# Patient Record
Sex: Female | Born: 1950 | Race: White | Hispanic: No | Marital: Married | Smoking: Never smoker
Health system: Southern US, Community
[De-identification: ages and names within clinical notes are randomized; demographics above are authoritative.]

---

## 2000-01-11 ENCOUNTER — Ambulatory Visit (HOSPITAL_COMMUNITY): Admission: RE | Admit: 2000-01-11 | Discharge: 2000-01-11 | Payer: Self-pay | Admitting: Family Medicine

## 2000-01-11 ENCOUNTER — Encounter: Payer: Self-pay | Admitting: Family Medicine

## 2000-03-25 ENCOUNTER — Other Ambulatory Visit: Admission: RE | Admit: 2000-03-25 | Discharge: 2000-03-25 | Payer: Self-pay | Admitting: *Deleted

## 2000-07-29 ENCOUNTER — Encounter: Payer: Self-pay | Admitting: *Deleted

## 2000-08-04 ENCOUNTER — Inpatient Hospital Stay (HOSPITAL_COMMUNITY): Admission: RE | Admit: 2000-08-04 | Discharge: 2000-08-06 | Payer: Self-pay | Admitting: *Deleted

## 2000-08-09 ENCOUNTER — Encounter: Admission: RE | Admit: 2000-08-09 | Discharge: 2000-08-09 | Payer: Self-pay | Admitting: General Surgery

## 2000-08-09 ENCOUNTER — Encounter: Payer: Self-pay | Admitting: General Surgery

## 2002-02-06 ENCOUNTER — Inpatient Hospital Stay (HOSPITAL_COMMUNITY): Admission: AD | Admit: 2002-02-06 | Discharge: 2002-02-09 | Payer: Self-pay | Admitting: Obstetrics & Gynecology

## 2002-02-07 ENCOUNTER — Encounter: Payer: Self-pay | Admitting: Obstetrics & Gynecology

## 2003-12-09 ENCOUNTER — Other Ambulatory Visit: Admission: RE | Admit: 2003-12-09 | Discharge: 2003-12-09 | Payer: Self-pay | Admitting: Family Medicine

## 2006-04-26 ENCOUNTER — Other Ambulatory Visit: Admission: RE | Admit: 2006-04-26 | Discharge: 2006-04-26 | Payer: Self-pay | Admitting: Family Medicine

## 2010-11-05 ENCOUNTER — Other Ambulatory Visit (HOSPITAL_COMMUNITY)
Admission: RE | Admit: 2010-11-05 | Discharge: 2010-11-05 | Disposition: A | Discharge status: Home / Self Care | Payer: BC Managed Care – PPO | Source: Ambulatory Visit | Attending: Family Medicine | Admitting: Family Medicine

## 2010-11-05 ENCOUNTER — Other Ambulatory Visit: Payer: Self-pay | Admitting: Family Medicine

## 2010-11-05 DIAGNOSIS — Z0000 Encounter for general adult medical examination without abnormal findings: Secondary | ICD-10-CM | POA: Insufficient documentation

## 2017-04-21 ENCOUNTER — Encounter (HOSPITAL_COMMUNITY): Payer: Self-pay | Admitting: Emergency Medicine

## 2017-04-21 ENCOUNTER — Emergency Department (HOSPITAL_COMMUNITY): Payer: Self-pay

## 2017-04-21 ENCOUNTER — Other Ambulatory Visit: Payer: Self-pay

## 2017-04-21 DIAGNOSIS — R0789 Other chest pain: Secondary | ICD-10-CM | POA: Insufficient documentation

## 2017-04-21 LAB — BASIC METABOLIC PANEL
ANION GAP: 11 (ref 5–15)
BUN: 10 mg/dL (ref 6–20)
CHLORIDE: 105 mmol/L (ref 101–111)
CO2: 23 mmol/L (ref 22–32)
Calcium: 9.7 mg/dL (ref 8.9–10.3)
Creatinine, Ser: 0.72 mg/dL (ref 0.44–1.00)
GFR calc Af Amer: >60 mL/min (ref >60)
GLUCOSE: 97 mg/dL (ref 65–99)
POTASSIUM: 3.6 mmol/L (ref 3.5–5.1)
Sodium: 139 mmol/L (ref 135–145)

## 2017-04-21 LAB — CBC
HCT: 40.1 % (ref 36.0–46.0)
HEMOGLOBIN: 13.5 g/dL (ref 12.0–15.0)
MCH: 30.2 pg (ref 26.0–34.0)
MCHC: 33.7 g/dL (ref 30.0–36.0)
MCV: 89.7 fL (ref 78.0–100.0)
Platelets: 311 10*3/uL (ref 150–400)
RBC: 4.47 MIL/uL (ref 3.87–5.11)
RDW: 12.7 % (ref 11.5–15.5)
WBC: 5.8 10*3/uL (ref 4.0–10.5)

## 2017-04-21 LAB — I-STAT TROPONIN, ED: Troponin i, poc: 0 ng/mL (ref 0.00–0.08)

## 2017-04-21 NOTE — ED Triage Notes (Signed)
Pt complaint of mid chest pressure radiating to shoulders and jaw onset at 1530 but subsides some since.

## 2017-04-22 ENCOUNTER — Emergency Department (HOSPITAL_COMMUNITY)
Admission: EM | Admit: 2017-04-22 | Discharge: 2017-04-22 | Disposition: A | Discharge status: Home / Self Care | Payer: Self-pay | Attending: Emergency Medicine | Admitting: Emergency Medicine

## 2017-04-22 DIAGNOSIS — R079 Chest pain, unspecified: Secondary | ICD-10-CM

## 2017-04-22 LAB — I-STAT TROPONIN, ED: Troponin i, poc: 0 ng/mL (ref 0.00–0.08)

## 2017-04-22 NOTE — Discharge Instructions (Signed)

## 2017-04-22 NOTE — ED Provider Notes (Signed)
[facility] DEPT Provider Note   CSN: [id number] Arrival date & time: 04/21/17  1806     History   Chief Complaint Chief Complaint  Patient presents with  . Chest Pain    HPI Gina Vega is a 67 y.o. female who presents the emergency department with chief complaint of chest pain.  Around 2 PM today she had onset of central retrosternal chest pain and pressure radiating to the right side of her chest, right jaw and right shoulder and some radiation into the left side of her chest.  She had associated shortness of breath.  This lasted for approximately 30 minutes and relieved however has been persistent but minimal since that time for the past 12 hours.  The patient states that she has been under significant stress and was driving to work today from [employer] power of attorney issues for a family friend.  The patient denies history of smoking, hypertension, hyperlipidemia, diabetes.  Her mother did have coronary artery disease and it was stented in her 39s however the patient denies a personal history of such.  She did not take any aspirin or nitroglycerin.  She was seen at an urgent care prior to arrival and directed here to the emergency department.  She denies nausea, vomiting, diaphoresis.  She still has a little bit of a sensation of tightness with deep breathing but denies pain.  She recently has had some symptoms of gastric reflux and has been taking over-the-counter antacids but states that this felt much different.  She has not had fever or chills.  HPI  History reviewed. No pertinent past medical history.  There are no active problems to display for this patient.   History reviewed. No pertinent surgical history.  OB History    No data available       Home Medications    Prior to Admission medications   Not on File    Family History No family history on file.  Social History Social History   Tobacco Use  . Smoking  status: Not on file  Substance Use Topics  . Alcohol use: Not on file  . Drug use: Not on file     Allergies   Latex   Review of Systems Review of Systems  Ten systems reviewed and are negative for acute change, except as noted in the HPI.   Physical Exam Updated Vital Signs BP (!) 146/74 (BP Location: Left Arm)   Pulse (!) 58   Temp 97.9 F (36.6 C) (Oral)   Resp 14   SpO2 99%   Physical Exam Physical Exam  Nursing note and vitals reviewed. Constitutional: She is oriented to person, place, and time. She appears well-developed and well-nourished. No distress.  HENT:  Head: Normocephalic and atraumatic.  Eyes: Conjunctivae normal and EOM are normal.  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  No scleral icterus.  Neck: Normal range of motion.  Cardiovascular: Normal rate, regular rhythm and normal heart sounds.  Exam reveals no gallop and no friction rub.   No murmur heard. Pulmonary/Chest: Effort normal and breath sounds normal. No respiratory distress.  Abdominal: Soft. Bowel sounds are normal. She exhibits no distension and no mass. There is no tenderness. There is no guarding.  Neurological: She is alert and oriented to person, place, and time.  Skin: Skin is warm and dry. She is not diaphoretic.     ED Treatments / Results  Labs (all labs ordered are listed, but  only abnormal results are displayed) Labs Reviewed  BASIC METABOLIC PANEL  CBC  I-STAT TROPONIN, ED  I-STAT TROPONIN, ED    EKG  EKG Interpretation  Date/Time:  Friday April 22 2017 05:04:42 EST Ventricular Rate:  53 PR Interval:    QRS Duration: 95 QT Interval:  410 QTC Calculation: 385 R Axis:   70 Text Interpretation:  Sinus rhythm Normal ECG No significant change was found Confirmed by Shanon Rosser [phone]) on 04/22/2017 5:28:19 AM       Radiology Dg Chest 2 View  Result Date: 04/21/2017 CLINICAL DATA:  Chest pain EXAM: CHEST  2 VIEW COMPARISON:  None.  FINDINGS: The heart size and mediastinal contours are within normal limits. Both lungs are clear. The visualized skeletal structures are unremarkable. Surgical clips in the right upper quadrant. IMPRESSION: No active cardiopulmonary disease. Electronically Signed   By: Donavan Foil M.D.   On: 04/21/2017 19:19    Procedures Procedures (including critical care time)  Medications Ordered in ED Medications - No data to display   Initial Impression / Assessment and Plan / ED Course  I have reviewed the triage vital signs and the nursing notes.  Pertinent labs & imaging results that were available during my care of the patient were reviewed by me and considered in my medical decision making (see chart for details).    Patient with 2- troponins over period of 12 hours.  Her EKG shows no signs of ischemic change.  Chest x-ray without abnormality.  She is a heart score of 2 making her low risk for major adverse cardiac event.  She is not having any current chest pain or shortness of breath patient advised to follow-up with her primary care physician.  I doubt any other emergent etiology chest discomfort.  She is well-appearing discussed return precautions.  Final Clinical Impressions(s) / ED Diagnoses   Final diagnoses:  Nonspecific chest pain    ED Discharge Orders    None       Margarita Mail, PA-C 04/22/17 0704    Shanon Rosser, MD 04/22/17 [phone]

## 2017-08-15 ENCOUNTER — Emergency Department (HOSPITAL_COMMUNITY): Payer: Self-pay

## 2017-08-15 ENCOUNTER — Encounter (HOSPITAL_COMMUNITY): Payer: Self-pay

## 2017-08-15 DIAGNOSIS — Y999 Unspecified external cause status: Secondary | ICD-10-CM | POA: Insufficient documentation

## 2017-08-15 DIAGNOSIS — Y92007 Garden or yard of unspecified non-institutional (private) residence as the place of occurrence of the external cause: Secondary | ICD-10-CM | POA: Insufficient documentation

## 2017-08-15 DIAGNOSIS — Y9301 Activity, walking, marching and hiking: Secondary | ICD-10-CM | POA: Insufficient documentation

## 2017-08-15 DIAGNOSIS — W1842XA Slipping, tripping and stumbling without falling due to stepping into hole or opening, initial encounter: Secondary | ICD-10-CM | POA: Insufficient documentation

## 2017-08-15 DIAGNOSIS — S82831A Other fracture of upper and lower end of right fibula, initial encounter for closed fracture: Secondary | ICD-10-CM | POA: Insufficient documentation

## 2017-08-15 NOTE — ED Triage Notes (Signed)
Pt reports that she step in a hole and fell and sustained injury to rt ankle are appears swollen and pt has va 9/10 pain that is descibed as sharp and hot at time.Pt denies any other injury, no LOC.

## 2017-08-16 ENCOUNTER — Emergency Department (HOSPITAL_COMMUNITY)
Admission: EM | Admit: 2017-08-16 | Discharge: 2017-08-16 | Disposition: A | Discharge status: Home / Self Care | Payer: Self-pay | Attending: Emergency Medicine | Admitting: Emergency Medicine

## 2017-08-16 ENCOUNTER — Emergency Department (HOSPITAL_COMMUNITY): Payer: Self-pay

## 2017-08-16 ENCOUNTER — Other Ambulatory Visit: Payer: Self-pay

## 2017-08-16 DIAGNOSIS — S82831A Other fracture of upper and lower end of right fibula, initial encounter for closed fracture: Secondary | ICD-10-CM

## 2017-08-16 MED ORDER — HYDROCODONE-ACETAMINOPHEN 5-325 MG PO TABS
2.0000 | ORAL_TABLET | Freq: Once | ORAL | Status: AC
  Administered 2017-08-16: 2 via ORAL
  Filled 2017-08-16: qty 2

## 2017-08-16 MED ORDER — HYDROCODONE-ACETAMINOPHEN 5-325 MG PO TABS: 1.0000 | ORAL_TABLET | ORAL | 0 refills | Status: AC | PRN

## 2017-08-16 NOTE — ED Notes (Signed)
Ortho tech to be paged for posterior splint application

## 2017-08-16 NOTE — ED Provider Notes (Signed)
[facility] DEPT Provider Note   CSN: [id number] Arrival date & time: 08/15/17  2143     History   Chief Complaint Chief Complaint  Patient presents with  . Ankle Pain    HPI Gina Vega is a 67 y.o. female.  Patient presents to the emergency department with a chief complaint of right ankle pain.  She states that she stepped in a hole in her yard and rolled her ankle.  She complains of pain with palpation and movement.  She has been unable to ambulate because of the pain.  She denies any numbness, weakness, or tingling.  She has not taken anything for symptoms.  The history is provided by the patient. No language interpreter was used.    History reviewed. No pertinent past medical history.  There are no active problems to display for this patient.   History reviewed. No pertinent surgical history.   OB History   None      Home Medications    Prior to Admission medications   Medication Sig Start Date End Date Taking? Authorizing Provider  HYDROcodone-acetaminophen (NORCO/VICODIN) 5-325 MG tablet Take 1-2 tablets by mouth every 4 (four) hours as needed. 08/16/17   Montine Circle, PA-C    Family History History reviewed. No pertinent family history.  Social History Social History   Tobacco Use  . Smoking status: Never Smoker  . Smokeless tobacco: Never Used  Substance Use Topics  . Alcohol use: Not Currently  . Drug use: Never     Allergies   Latex   Review of Systems Review of Systems  All other systems reviewed and are negative.    Physical Exam Updated Vital Signs BP 135/75 (BP Location: Right Arm)   Pulse (!) 55   Temp 98.6 F (37 C) (Oral)   Resp 16   Ht 5\' 3"  (1.6 m)   Wt 65.8 kg (145 lb)   SpO2 99%   BMI 25.69 kg/m   Physical Exam  Nursing note and vitals reviewed.  Constitutional: Pt appears well-developed and well-nourished. No distress.  HENT:  Head: Normocephalic and atraumatic.    Eyes: Conjunctivae are normal.  Neck: Normal range of motion.  Cardiovascular: Normal rate, regular rhythm. Intact distal pulses.   Capillary refill < 3 sec.  Pulmonary/Chest: Effort normal and breath sounds normal.  Musculoskeletal:  Right lower extremity Pt exhibits moderate swelling about the lateral ankle with tenderness to palpation over the distal fibula.   ROM: Deferred secondary to pain  Strength: Deferred secondary to pain Neurological: Pt  is alert. Coordination normal.  Sensation: 5/5 Skin: Skin is warm and dry. Pt is not diaphoretic.  No evidence of open wound or skin tenting Psychiatric: Pt has a normal mood and affect.     ED Treatments / Results  Labs (all labs ordered are listed, but only abnormal results are displayed) Labs Reviewed - No data to display  EKG None  Radiology Dg Ankle Complete Right  Result Date: 08/15/2017 CLINICAL DATA:  Patient stepped in a hole earlier today and twisted the ankle. Lateral pain and swelling. EXAM: RIGHT ANKLE - COMPLETE 3+ VIEW COMPARISON:  None. FINDINGS: There is a transverse fracture of the distal right fibula with extension to the talofibular joint. No significant dislocation of the right ankle. Lateral soft tissue swelling. IMPRESSION: Transverse fracture of the distal right fibula with overlying soft tissue swelling. Electronically Signed   By: Lucienne Capers M.D.   On: 08/15/2017 23:07   Dg  Knee Complete 4 Views Right  Result Date: 08/16/2017 CLINICAL DATA:  Patient stepped in a hole on 08/15/2017. Fracture to the ankle. Knee hurts. EXAM: RIGHT KNEE - COMPLETE 4+ VIEW COMPARISON:  None. FINDINGS: Degenerative changes in the right knee with medial and lateral compartment narrowing and small osteophytes. No evidence of acute fracture or dislocation. No focal bone lesion or bone destruction. Bone cortex appears intact. No significant effusion. Soft tissues are unremarkable. IMPRESSION: Mild degenerative changes in the right  knee. No acute bony abnormalities. Electronically Signed   By: Lucienne Capers M.D.   On: 08/16/2017 03:02    Procedures Procedures (including critical care time)  Medications Ordered in ED Medications  HYDROcodone-acetaminophen (NORCO/VICODIN) 5-325 MG per tablet 2 tablet (2 tablets Oral Given 08/16/17 0238)     Initial Impression / Assessment and Plan / ED Course  I have reviewed the triage vital signs and the nursing notes.  Pertinent labs & imaging results that were available during my care of the patient were reviewed by me and considered in my medical decision making (see chart for details).     Patient presents with injury to right ankle.  DDx includes, fracture, strain, or sprain.  Consultants: None  Plain films reveal transverse distal fibula fracture.  Pt advised to follow up with PCP and/or orthopedics. Patient given posterior splint and crutches while in ED, conservative therapy such as RICE recommended and discussed.   Patient will be discharged home & is agreeable with above plan. Returns precautions discussed. Pt appears safe for discharge.   Final Clinical Impressions(s) / ED Diagnoses   Final diagnoses:  Other closed fracture of distal end of right fibula, initial encounter    ED Discharge Orders        Ordered    HYDROcodone-acetaminophen (NORCO/VICODIN) 5-325 MG tablet  Every 4 hours PRN     08/16/17 0307       Montine Circle, PA-C 08/16/17 5409    Ezequiel Essex, MD 08/16/17 [phone]

## 2017-08-16 NOTE — ED Notes (Signed)
Bed: WTR5 Expected date:  Expected time:  Means of arrival:  Comments: 

## 2017-10-05 DIAGNOSIS — H40023 Open angle with borderline findings, high risk, bilateral: Secondary | ICD-10-CM | POA: Diagnosis not present

## 2017-10-18 DIAGNOSIS — S82831D Other fracture of upper and lower end of right fibula, subsequent encounter for closed fracture with routine healing: Secondary | ICD-10-CM | POA: Diagnosis not present

## 2018-01-09 DIAGNOSIS — R69 Illness, unspecified: Secondary | ICD-10-CM | POA: Diagnosis not present

## 2018-02-23 DIAGNOSIS — J209 Acute bronchitis, unspecified: Secondary | ICD-10-CM | POA: Diagnosis not present

## 2018-04-10 DIAGNOSIS — H40023 Open angle with borderline findings, high risk, bilateral: Secondary | ICD-10-CM | POA: Diagnosis not present

## 2018-05-05 DIAGNOSIS — Z0000 Encounter for general adult medical examination without abnormal findings: Secondary | ICD-10-CM | POA: Diagnosis not present

## 2018-05-05 DIAGNOSIS — Z23 Encounter for immunization: Secondary | ICD-10-CM | POA: Diagnosis not present

## 2018-05-05 DIAGNOSIS — E785 Hyperlipidemia, unspecified: Secondary | ICD-10-CM | POA: Diagnosis not present

## 2018-10-02 DIAGNOSIS — M8589 Other specified disorders of bone density and structure, multiple sites: Secondary | ICD-10-CM | POA: Diagnosis not present

## 2018-10-02 DIAGNOSIS — Z90710 Acquired absence of both cervix and uterus: Secondary | ICD-10-CM | POA: Diagnosis not present

## 2018-10-02 DIAGNOSIS — Z780 Asymptomatic menopausal state: Secondary | ICD-10-CM | POA: Diagnosis not present

## 2018-10-11 DIAGNOSIS — Z20828 Contact with and (suspected) exposure to other viral communicable diseases: Secondary | ICD-10-CM | POA: Diagnosis not present

## 2018-10-12 DIAGNOSIS — Z20828 Contact with and (suspected) exposure to other viral communicable diseases: Secondary | ICD-10-CM | POA: Diagnosis not present

## 2018-11-25 DIAGNOSIS — R69 Illness, unspecified: Secondary | ICD-10-CM | POA: Diagnosis not present

## 2018-12-22 DIAGNOSIS — N8110 Cystocele, unspecified: Secondary | ICD-10-CM | POA: Diagnosis not present

## 2018-12-25 ENCOUNTER — Other Ambulatory Visit: Payer: Self-pay

## 2018-12-25 DIAGNOSIS — Z20822 Contact with and (suspected) exposure to covid-19: Secondary | ICD-10-CM

## 2018-12-26 LAB — NOVEL CORONAVIRUS, NAA: SARS-CoV-2, NAA: NOT DETECTED

## 2019-01-10 ENCOUNTER — Other Ambulatory Visit: Payer: Self-pay

## 2019-01-10 DIAGNOSIS — Z20822 Contact with and (suspected) exposure to covid-19: Secondary | ICD-10-CM

## 2019-01-12 LAB — NOVEL CORONAVIRUS, NAA: SARS-CoV-2, NAA: NOT DETECTED

## 2019-01-30 IMAGING — CR DG ANKLE COMPLETE 3+V*R*
3 series · 3 of 3 positions shown · non-contrast
Comparison: None.

CLINICAL DATA: Patient stepped in a hole earlier today and twisted
the ankle. Lateral pain and swelling.

EXAM:
RIGHT ANKLE - COMPLETE 3+ VIEW

[x ankle ap right]
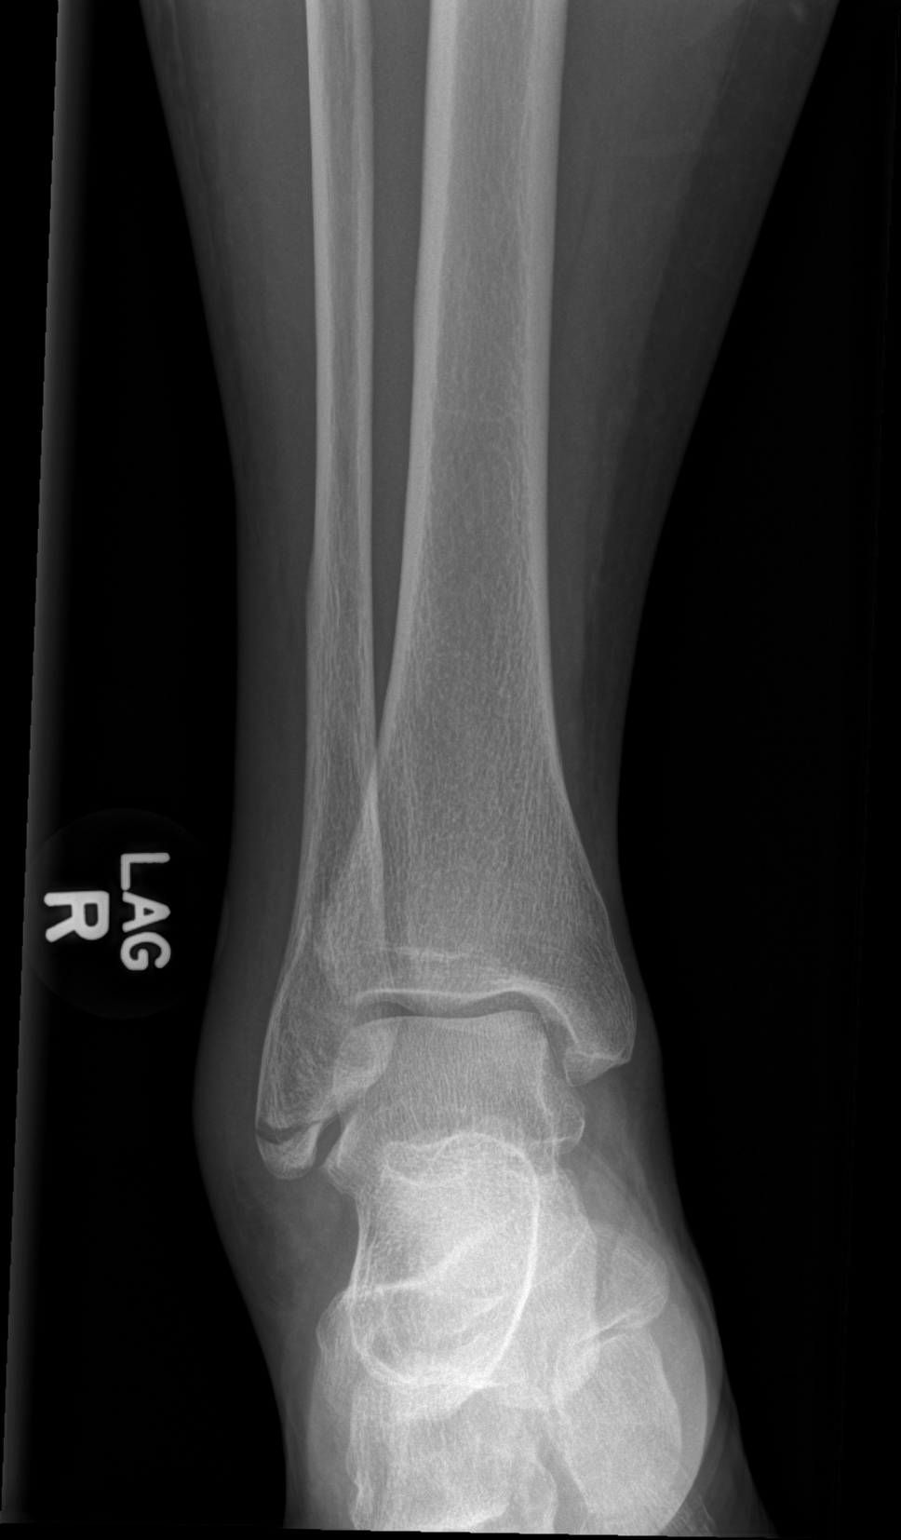

[x ankle obl right]
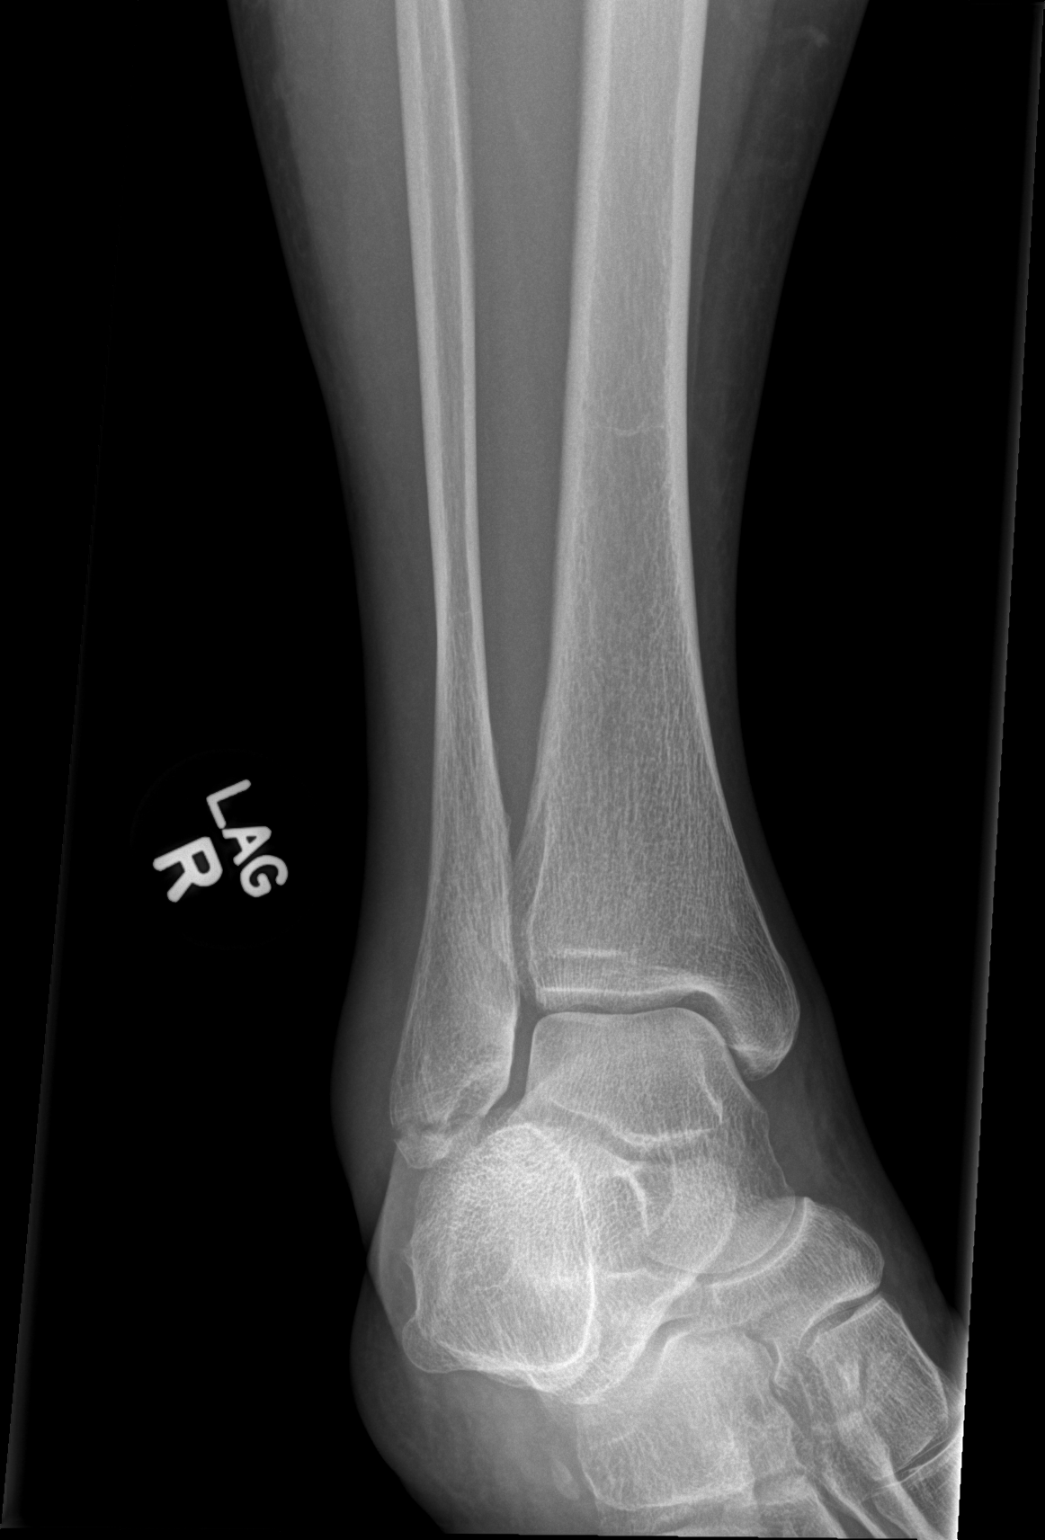

[x ankle lat right]
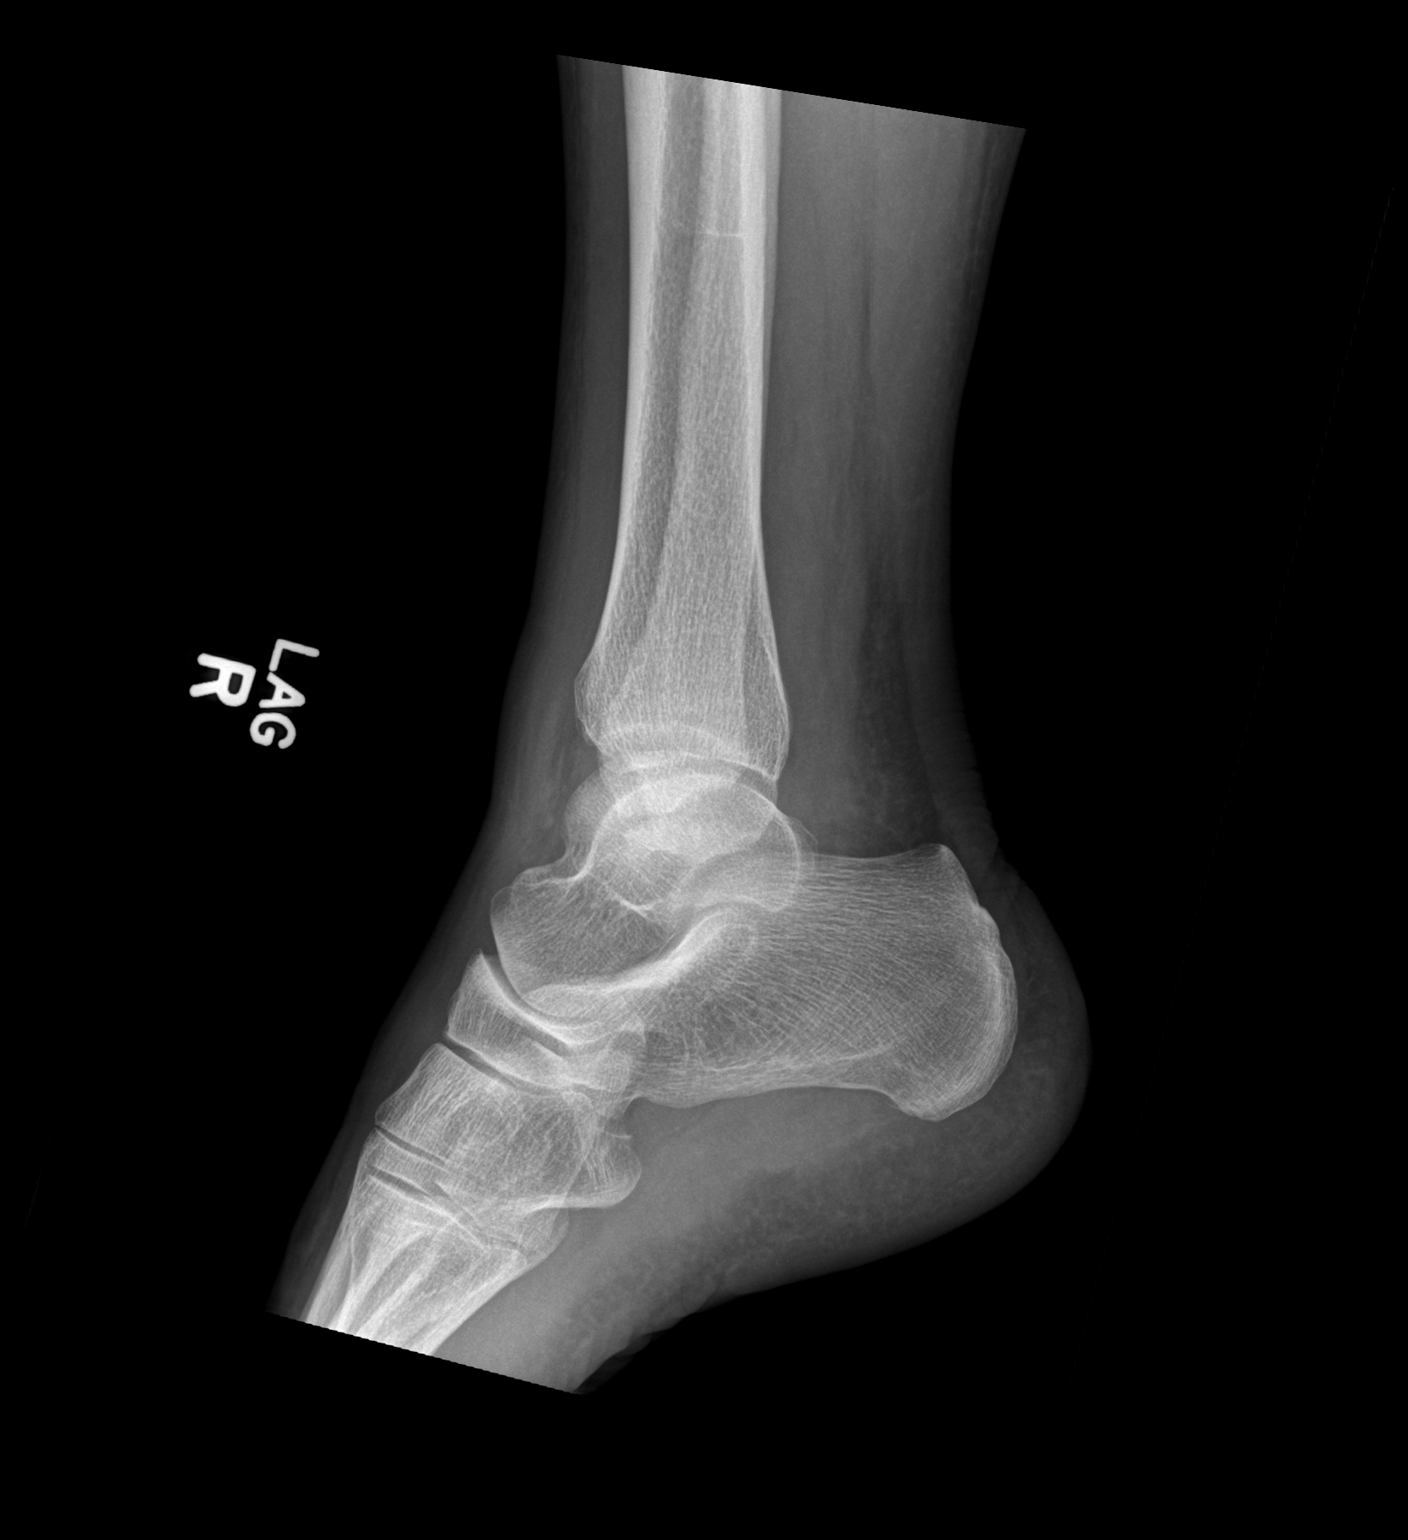

[3 of 3 positions shown; findings below may reference images not displayed]

FINDINGS: There is a transverse fracture of the distal right fibula with
extension to the talofibular joint. No significant dislocation of
the right ankle. Lateral soft tissue swelling.
IMPRESSION: Transverse fracture of the distal right fibula with overlying soft
tissue swelling.

## 2019-02-07 DIAGNOSIS — H40023 Open angle with borderline findings, high risk, bilateral: Secondary | ICD-10-CM | POA: Diagnosis not present

## 2019-02-07 DIAGNOSIS — H2513 Age-related nuclear cataract, bilateral: Secondary | ICD-10-CM | POA: Diagnosis not present

## 2019-02-07 DIAGNOSIS — H40033 Anatomical narrow angle, bilateral: Secondary | ICD-10-CM | POA: Diagnosis not present

## 2019-03-15 DIAGNOSIS — M6281 Muscle weakness (generalized): Secondary | ICD-10-CM | POA: Diagnosis not present

## 2019-03-15 DIAGNOSIS — M62838 Other muscle spasm: Secondary | ICD-10-CM | POA: Diagnosis not present

## 2019-03-15 DIAGNOSIS — M6289 Other specified disorders of muscle: Secondary | ICD-10-CM | POA: Diagnosis not present

## 2019-03-15 DIAGNOSIS — N8110 Cystocele, unspecified: Secondary | ICD-10-CM | POA: Diagnosis not present

## 2019-03-20 ENCOUNTER — Ambulatory Visit: Payer: Medicare HMO | Attending: Internal Medicine

## 2019-03-20 DIAGNOSIS — Z20822 Contact with and (suspected) exposure to covid-19: Secondary | ICD-10-CM

## 2019-03-21 LAB — NOVEL CORONAVIRUS, NAA: SARS-CoV-2, NAA: NOT DETECTED

## 2019-04-02 DIAGNOSIS — M6289 Other specified disorders of muscle: Secondary | ICD-10-CM | POA: Diagnosis not present

## 2019-04-02 DIAGNOSIS — M62838 Other muscle spasm: Secondary | ICD-10-CM | POA: Diagnosis not present

## 2019-04-02 DIAGNOSIS — N8110 Cystocele, unspecified: Secondary | ICD-10-CM | POA: Diagnosis not present

## 2019-04-02 DIAGNOSIS — M6281 Muscle weakness (generalized): Secondary | ICD-10-CM | POA: Diagnosis not present

## 2019-04-03 ENCOUNTER — Ambulatory Visit: Payer: Medicare HMO | Attending: Internal Medicine

## 2019-04-03 DIAGNOSIS — Z20822 Contact with and (suspected) exposure to covid-19: Secondary | ICD-10-CM | POA: Diagnosis not present

## 2019-04-04 LAB — NOVEL CORONAVIRUS, NAA: SARS-CoV-2, NAA: NOT DETECTED

## 2019-04-24 DIAGNOSIS — N8110 Cystocele, unspecified: Secondary | ICD-10-CM | POA: Diagnosis not present

## 2019-04-24 DIAGNOSIS — M6289 Other specified disorders of muscle: Secondary | ICD-10-CM | POA: Diagnosis not present

## 2019-04-24 DIAGNOSIS — M6281 Muscle weakness (generalized): Secondary | ICD-10-CM | POA: Diagnosis not present

## 2019-04-24 DIAGNOSIS — M62838 Other muscle spasm: Secondary | ICD-10-CM | POA: Diagnosis not present

## 2019-05-07 DIAGNOSIS — M62838 Other muscle spasm: Secondary | ICD-10-CM | POA: Diagnosis not present

## 2019-05-07 DIAGNOSIS — M6281 Muscle weakness (generalized): Secondary | ICD-10-CM | POA: Diagnosis not present

## 2019-05-07 DIAGNOSIS — M6289 Other specified disorders of muscle: Secondary | ICD-10-CM | POA: Diagnosis not present

## 2019-05-07 DIAGNOSIS — N8110 Cystocele, unspecified: Secondary | ICD-10-CM | POA: Diagnosis not present

## 2019-05-09 DIAGNOSIS — Z0000 Encounter for general adult medical examination without abnormal findings: Secondary | ICD-10-CM | POA: Diagnosis not present

## 2019-05-09 DIAGNOSIS — E785 Hyperlipidemia, unspecified: Secondary | ICD-10-CM | POA: Diagnosis not present

## 2019-05-09 DIAGNOSIS — M8588 Other specified disorders of bone density and structure, other site: Secondary | ICD-10-CM | POA: Diagnosis not present

## 2019-05-22 DIAGNOSIS — M6289 Other specified disorders of muscle: Secondary | ICD-10-CM | POA: Diagnosis not present

## 2019-05-22 DIAGNOSIS — N8110 Cystocele, unspecified: Secondary | ICD-10-CM | POA: Diagnosis not present

## 2019-05-22 DIAGNOSIS — M6281 Muscle weakness (generalized): Secondary | ICD-10-CM | POA: Diagnosis not present

## 2019-05-22 DIAGNOSIS — M62838 Other muscle spasm: Secondary | ICD-10-CM | POA: Diagnosis not present

## 2019-06-11 DIAGNOSIS — Z23 Encounter for immunization: Secondary | ICD-10-CM | POA: Diagnosis not present

## 2019-06-15 DIAGNOSIS — M6289 Other specified disorders of muscle: Secondary | ICD-10-CM | POA: Diagnosis not present

## 2019-06-15 DIAGNOSIS — N8110 Cystocele, unspecified: Secondary | ICD-10-CM | POA: Diagnosis not present

## 2019-06-15 DIAGNOSIS — M6281 Muscle weakness (generalized): Secondary | ICD-10-CM | POA: Diagnosis not present

## 2019-06-15 DIAGNOSIS — M62838 Other muscle spasm: Secondary | ICD-10-CM | POA: Diagnosis not present

## 2019-09-19 ENCOUNTER — Ambulatory Visit: Payer: Medicare HMO | Attending: Internal Medicine

## 2019-09-19 DIAGNOSIS — Z20822 Contact with and (suspected) exposure to covid-19: Secondary | ICD-10-CM | POA: Diagnosis not present

## 2019-09-20 LAB — SARS-COV-2, NAA 2 DAY TAT

## 2019-09-20 LAB — NOVEL CORONAVIRUS, NAA: SARS-CoV-2, NAA: NOT DETECTED

## 2019-11-26 DIAGNOSIS — H40033 Anatomical narrow angle, bilateral: Secondary | ICD-10-CM | POA: Diagnosis not present

## 2019-11-26 DIAGNOSIS — H524 Presbyopia: Secondary | ICD-10-CM | POA: Diagnosis not present

## 2019-11-26 DIAGNOSIS — H2513 Age-related nuclear cataract, bilateral: Secondary | ICD-10-CM | POA: Diagnosis not present

## 2019-11-26 DIAGNOSIS — H40023 Open angle with borderline findings, high risk, bilateral: Secondary | ICD-10-CM | POA: Diagnosis not present

## 2020-01-16 DIAGNOSIS — J029 Acute pharyngitis, unspecified: Secondary | ICD-10-CM | POA: Diagnosis not present

## 2020-01-16 DIAGNOSIS — R059 Cough, unspecified: Secondary | ICD-10-CM | POA: Diagnosis not present

## 2020-01-17 DIAGNOSIS — J029 Acute pharyngitis, unspecified: Secondary | ICD-10-CM | POA: Diagnosis not present

## 2020-01-17 DIAGNOSIS — Z20828 Contact with and (suspected) exposure to other viral communicable diseases: Secondary | ICD-10-CM | POA: Diagnosis not present

## 2020-03-21 DIAGNOSIS — Z1231 Encounter for screening mammogram for malignant neoplasm of breast: Secondary | ICD-10-CM | POA: Diagnosis not present

## 2020-05-09 DIAGNOSIS — E785 Hyperlipidemia, unspecified: Secondary | ICD-10-CM | POA: Diagnosis not present

## 2020-05-09 DIAGNOSIS — Z0000 Encounter for general adult medical examination without abnormal findings: Secondary | ICD-10-CM | POA: Diagnosis not present

## 2020-05-09 DIAGNOSIS — M8588 Other specified disorders of bone density and structure, other site: Secondary | ICD-10-CM | POA: Diagnosis not present

## 2020-05-12 DIAGNOSIS — Z01812 Encounter for preprocedural laboratory examination: Secondary | ICD-10-CM | POA: Diagnosis not present

## 2020-05-15 DIAGNOSIS — Z86010 Personal history of colonic polyps: Secondary | ICD-10-CM | POA: Diagnosis not present

## 2020-05-15 DIAGNOSIS — K640 First degree hemorrhoids: Secondary | ICD-10-CM | POA: Diagnosis not present

## 2020-05-15 DIAGNOSIS — D122 Benign neoplasm of ascending colon: Secondary | ICD-10-CM | POA: Diagnosis not present

## 2020-05-15 DIAGNOSIS — K635 Polyp of colon: Secondary | ICD-10-CM | POA: Diagnosis not present

## 2020-05-15 DIAGNOSIS — D123 Benign neoplasm of transverse colon: Secondary | ICD-10-CM | POA: Diagnosis not present

## 2020-05-20 DIAGNOSIS — D122 Benign neoplasm of ascending colon: Secondary | ICD-10-CM | POA: Diagnosis not present

## 2020-05-20 DIAGNOSIS — K635 Polyp of colon: Secondary | ICD-10-CM | POA: Diagnosis not present

## 2020-05-20 DIAGNOSIS — D123 Benign neoplasm of transverse colon: Secondary | ICD-10-CM | POA: Diagnosis not present

## 2020-06-03 DIAGNOSIS — H40023 Open angle with borderline findings, high risk, bilateral: Secondary | ICD-10-CM | POA: Diagnosis not present

## 2020-08-22 DIAGNOSIS — J209 Acute bronchitis, unspecified: Secondary | ICD-10-CM | POA: Diagnosis not present

## 2020-08-29 DIAGNOSIS — L700 Acne vulgaris: Secondary | ICD-10-CM | POA: Diagnosis not present

## 2020-08-29 DIAGNOSIS — Z808 Family history of malignant neoplasm of other organs or systems: Secondary | ICD-10-CM | POA: Diagnosis not present

## 2020-08-29 DIAGNOSIS — D229 Melanocytic nevi, unspecified: Secondary | ICD-10-CM | POA: Diagnosis not present

## 2020-12-01 DIAGNOSIS — L720 Epidermal cyst: Secondary | ICD-10-CM | POA: Diagnosis not present

## 2020-12-01 DIAGNOSIS — D220 Melanocytic nevi of lip: Secondary | ICD-10-CM | POA: Diagnosis not present

## 2020-12-01 DIAGNOSIS — L814 Other melanin hyperpigmentation: Secondary | ICD-10-CM | POA: Diagnosis not present

## 2020-12-01 DIAGNOSIS — D2271 Melanocytic nevi of right lower limb, including hip: Secondary | ICD-10-CM | POA: Diagnosis not present

## 2020-12-01 DIAGNOSIS — D1801 Hemangioma of skin and subcutaneous tissue: Secondary | ICD-10-CM | POA: Diagnosis not present

## 2020-12-01 DIAGNOSIS — D2262 Melanocytic nevi of left upper limb, including shoulder: Secondary | ICD-10-CM | POA: Diagnosis not present

## 2020-12-01 DIAGNOSIS — L821 Other seborrheic keratosis: Secondary | ICD-10-CM | POA: Diagnosis not present

## 2020-12-01 DIAGNOSIS — D2261 Melanocytic nevi of right upper limb, including shoulder: Secondary | ICD-10-CM | POA: Diagnosis not present

## 2020-12-01 DIAGNOSIS — D225 Melanocytic nevi of trunk: Secondary | ICD-10-CM | POA: Diagnosis not present

## 2021-03-24 DIAGNOSIS — Z1231 Encounter for screening mammogram for malignant neoplasm of breast: Secondary | ICD-10-CM | POA: Diagnosis not present

## 2021-05-13 DIAGNOSIS — Z0000 Encounter for general adult medical examination without abnormal findings: Secondary | ICD-10-CM | POA: Diagnosis not present

## 2021-05-13 DIAGNOSIS — E785 Hyperlipidemia, unspecified: Secondary | ICD-10-CM | POA: Diagnosis not present

## 2021-05-13 DIAGNOSIS — M8588 Other specified disorders of bone density and structure, other site: Secondary | ICD-10-CM | POA: Diagnosis not present

## 2021-06-23 DIAGNOSIS — Z780 Asymptomatic menopausal state: Secondary | ICD-10-CM | POA: Diagnosis not present

## 2021-06-23 DIAGNOSIS — M85852 Other specified disorders of bone density and structure, left thigh: Secondary | ICD-10-CM | POA: Diagnosis not present

## 2021-06-23 DIAGNOSIS — M85851 Other specified disorders of bone density and structure, right thigh: Secondary | ICD-10-CM | POA: Diagnosis not present

## 2021-06-25 DIAGNOSIS — H40032 Anatomical narrow angle, left eye: Secondary | ICD-10-CM | POA: Diagnosis not present

## 2021-10-07 DIAGNOSIS — M25512 Pain in left shoulder: Secondary | ICD-10-CM | POA: Diagnosis not present

## 2021-10-13 DIAGNOSIS — M25512 Pain in left shoulder: Secondary | ICD-10-CM | POA: Diagnosis not present

## 2021-10-13 DIAGNOSIS — M7502 Adhesive capsulitis of left shoulder: Secondary | ICD-10-CM | POA: Diagnosis not present

## 2021-10-23 DIAGNOSIS — M25612 Stiffness of left shoulder, not elsewhere classified: Secondary | ICD-10-CM | POA: Diagnosis not present

## 2021-10-23 DIAGNOSIS — M25512 Pain in left shoulder: Secondary | ICD-10-CM | POA: Diagnosis not present

## 2021-11-04 DIAGNOSIS — M25612 Stiffness of left shoulder, not elsewhere classified: Secondary | ICD-10-CM | POA: Diagnosis not present

## 2021-11-04 DIAGNOSIS — M25512 Pain in left shoulder: Secondary | ICD-10-CM | POA: Diagnosis not present

## 2021-11-06 DIAGNOSIS — M25612 Stiffness of left shoulder, not elsewhere classified: Secondary | ICD-10-CM | POA: Diagnosis not present

## 2021-11-06 DIAGNOSIS — M25512 Pain in left shoulder: Secondary | ICD-10-CM | POA: Diagnosis not present

## 2021-11-10 DIAGNOSIS — M25612 Stiffness of left shoulder, not elsewhere classified: Secondary | ICD-10-CM | POA: Diagnosis not present

## 2021-11-10 DIAGNOSIS — M25512 Pain in left shoulder: Secondary | ICD-10-CM | POA: Diagnosis not present

## 2021-11-12 DIAGNOSIS — M25512 Pain in left shoulder: Secondary | ICD-10-CM | POA: Diagnosis not present

## 2021-11-12 DIAGNOSIS — M25612 Stiffness of left shoulder, not elsewhere classified: Secondary | ICD-10-CM | POA: Diagnosis not present

## 2021-11-17 DIAGNOSIS — M25612 Stiffness of left shoulder, not elsewhere classified: Secondary | ICD-10-CM | POA: Diagnosis not present

## 2021-11-17 DIAGNOSIS — M25512 Pain in left shoulder: Secondary | ICD-10-CM | POA: Diagnosis not present

## 2021-11-20 DIAGNOSIS — M25512 Pain in left shoulder: Secondary | ICD-10-CM | POA: Diagnosis not present

## 2021-11-20 DIAGNOSIS — M25612 Stiffness of left shoulder, not elsewhere classified: Secondary | ICD-10-CM | POA: Diagnosis not present

## 2021-11-23 DIAGNOSIS — M25512 Pain in left shoulder: Secondary | ICD-10-CM | POA: Diagnosis not present

## 2021-11-23 DIAGNOSIS — M25612 Stiffness of left shoulder, not elsewhere classified: Secondary | ICD-10-CM | POA: Diagnosis not present

## 2021-11-25 DIAGNOSIS — M7502 Adhesive capsulitis of left shoulder: Secondary | ICD-10-CM | POA: Diagnosis not present

## 2021-12-03 DIAGNOSIS — L821 Other seborrheic keratosis: Secondary | ICD-10-CM | POA: Diagnosis not present

## 2021-12-03 DIAGNOSIS — D220 Melanocytic nevi of lip: Secondary | ICD-10-CM | POA: Diagnosis not present

## 2021-12-03 DIAGNOSIS — D2272 Melanocytic nevi of left lower limb, including hip: Secondary | ICD-10-CM | POA: Diagnosis not present

## 2021-12-03 DIAGNOSIS — D2271 Melanocytic nevi of right lower limb, including hip: Secondary | ICD-10-CM | POA: Diagnosis not present

## 2021-12-03 DIAGNOSIS — D2261 Melanocytic nevi of right upper limb, including shoulder: Secondary | ICD-10-CM | POA: Diagnosis not present

## 2021-12-03 DIAGNOSIS — L814 Other melanin hyperpigmentation: Secondary | ICD-10-CM | POA: Diagnosis not present

## 2021-12-03 DIAGNOSIS — D225 Melanocytic nevi of trunk: Secondary | ICD-10-CM | POA: Diagnosis not present

## 2021-12-03 DIAGNOSIS — D1801 Hemangioma of skin and subcutaneous tissue: Secondary | ICD-10-CM | POA: Diagnosis not present

## 2021-12-03 DIAGNOSIS — D1722 Benign lipomatous neoplasm of skin and subcutaneous tissue of left arm: Secondary | ICD-10-CM | POA: Diagnosis not present

## 2022-02-02 DIAGNOSIS — G8929 Other chronic pain: Secondary | ICD-10-CM | POA: Diagnosis not present

## 2022-02-02 DIAGNOSIS — R030 Elevated blood-pressure reading, without diagnosis of hypertension: Secondary | ICD-10-CM | POA: Diagnosis not present

## 2022-02-02 DIAGNOSIS — Z008 Encounter for other general examination: Secondary | ICD-10-CM | POA: Diagnosis not present

## 2022-02-02 DIAGNOSIS — Z808 Family history of malignant neoplasm of other organs or systems: Secondary | ICD-10-CM | POA: Diagnosis not present

## 2022-02-02 DIAGNOSIS — I951 Orthostatic hypotension: Secondary | ICD-10-CM | POA: Diagnosis not present

## 2022-02-02 DIAGNOSIS — Z91040 Latex allergy status: Secondary | ICD-10-CM | POA: Diagnosis not present

## 2022-03-30 DIAGNOSIS — Z1231 Encounter for screening mammogram for malignant neoplasm of breast: Secondary | ICD-10-CM | POA: Diagnosis not present

## 2022-07-07 DIAGNOSIS — Z0000 Encounter for general adult medical examination without abnormal findings: Secondary | ICD-10-CM | POA: Diagnosis not present

## 2022-07-07 DIAGNOSIS — E785 Hyperlipidemia, unspecified: Secondary | ICD-10-CM | POA: Diagnosis not present

## 2022-07-07 DIAGNOSIS — Z1331 Encounter for screening for depression: Secondary | ICD-10-CM | POA: Diagnosis not present

## 2022-08-31 DIAGNOSIS — U071 COVID-19: Secondary | ICD-10-CM | POA: Diagnosis not present

## 2022-08-31 DIAGNOSIS — J069 Acute upper respiratory infection, unspecified: Secondary | ICD-10-CM | POA: Diagnosis not present

## 2023-02-17 DIAGNOSIS — R509 Fever, unspecified: Secondary | ICD-10-CM | POA: Diagnosis not present

## 2023-02-17 DIAGNOSIS — U071 COVID-19: Secondary | ICD-10-CM | POA: Diagnosis not present

## 2023-02-17 DIAGNOSIS — R0981 Nasal congestion: Secondary | ICD-10-CM | POA: Diagnosis not present

## 2023-02-17 DIAGNOSIS — R059 Cough, unspecified: Secondary | ICD-10-CM | POA: Diagnosis not present

## 2023-04-05 DIAGNOSIS — Z1231 Encounter for screening mammogram for malignant neoplasm of breast: Secondary | ICD-10-CM | POA: Diagnosis not present

## 2023-04-10 DIAGNOSIS — J209 Acute bronchitis, unspecified: Secondary | ICD-10-CM | POA: Diagnosis not present

## 2023-04-10 DIAGNOSIS — R0609 Other forms of dyspnea: Secondary | ICD-10-CM | POA: Diagnosis not present

## 2023-04-10 DIAGNOSIS — J189 Pneumonia, unspecified organism: Secondary | ICD-10-CM | POA: Diagnosis not present

## 2023-04-10 DIAGNOSIS — J101 Influenza due to other identified influenza virus with other respiratory manifestations: Secondary | ICD-10-CM | POA: Diagnosis not present

## 2023-04-10 DIAGNOSIS — R509 Fever, unspecified: Secondary | ICD-10-CM | POA: Diagnosis not present

## 2023-04-13 DIAGNOSIS — J189 Pneumonia, unspecified organism: Secondary | ICD-10-CM | POA: Diagnosis not present

## 2023-12-14 DIAGNOSIS — D1801 Hemangioma of skin and subcutaneous tissue: Secondary | ICD-10-CM | POA: Diagnosis not present

## 2023-12-14 DIAGNOSIS — D2239 Melanocytic nevi of other parts of face: Secondary | ICD-10-CM | POA: Diagnosis not present

## 2023-12-14 DIAGNOSIS — D2272 Melanocytic nevi of left lower limb, including hip: Secondary | ICD-10-CM | POA: Diagnosis not present

## 2023-12-14 DIAGNOSIS — L813 Cafe au lait spots: Secondary | ICD-10-CM | POA: Diagnosis not present

## 2023-12-14 DIAGNOSIS — L821 Other seborrheic keratosis: Secondary | ICD-10-CM | POA: Diagnosis not present

## 2023-12-14 DIAGNOSIS — D225 Melanocytic nevi of trunk: Secondary | ICD-10-CM | POA: Diagnosis not present

## 2023-12-14 DIAGNOSIS — D2221 Melanocytic nevi of right ear and external auricular canal: Secondary | ICD-10-CM | POA: Diagnosis not present

## 2023-12-14 DIAGNOSIS — L814 Other melanin hyperpigmentation: Secondary | ICD-10-CM | POA: Diagnosis not present

## 2023-12-28 ENCOUNTER — Other Ambulatory Visit (HOSPITAL_COMMUNITY): Payer: Self-pay | Admitting: Family Medicine

## 2023-12-28 DIAGNOSIS — R001 Bradycardia, unspecified: Secondary | ICD-10-CM | POA: Diagnosis not present

## 2023-12-28 DIAGNOSIS — E785 Hyperlipidemia, unspecified: Secondary | ICD-10-CM | POA: Diagnosis not present

## 2023-12-28 DIAGNOSIS — Z1211 Encounter for screening for malignant neoplasm of colon: Secondary | ICD-10-CM | POA: Diagnosis not present

## 2023-12-28 DIAGNOSIS — E2839 Other primary ovarian failure: Secondary | ICD-10-CM | POA: Diagnosis not present

## 2023-12-28 DIAGNOSIS — Z0000 Encounter for general adult medical examination without abnormal findings: Secondary | ICD-10-CM | POA: Diagnosis not present

## 2023-12-28 DIAGNOSIS — M8588 Other specified disorders of bone density and structure, other site: Secondary | ICD-10-CM | POA: Diagnosis not present

## 2023-12-28 DIAGNOSIS — Z1331 Encounter for screening for depression: Secondary | ICD-10-CM | POA: Diagnosis not present

## 2024-01-13 ENCOUNTER — Ambulatory Visit (HOSPITAL_COMMUNITY)
Admission: RE | Admit: 2024-01-13 | Discharge: 2024-01-13 | Disposition: A | Discharge status: Home / Self Care | Payer: Self-pay | Source: Ambulatory Visit | Attending: Family Medicine | Admitting: Family Medicine

## 2024-01-13 DIAGNOSIS — E785 Hyperlipidemia, unspecified: Secondary | ICD-10-CM | POA: Insufficient documentation

## 2024-02-03 DIAGNOSIS — E785 Hyperlipidemia, unspecified: Secondary | ICD-10-CM | POA: Diagnosis not present

## 2024-02-03 DIAGNOSIS — I2510 Atherosclerotic heart disease of native coronary artery without angina pectoris: Secondary | ICD-10-CM | POA: Diagnosis not present

## 2024-02-09 DIAGNOSIS — M810 Age-related osteoporosis without current pathological fracture: Secondary | ICD-10-CM | POA: Diagnosis not present

## 2024-02-24 DIAGNOSIS — H40013 Open angle with borderline findings, low risk, bilateral: Secondary | ICD-10-CM | POA: Diagnosis not present

## 2024-02-24 DIAGNOSIS — H2513 Age-related nuclear cataract, bilateral: Secondary | ICD-10-CM | POA: Diagnosis not present

## 2024-02-24 DIAGNOSIS — H524 Presbyopia: Secondary | ICD-10-CM | POA: Diagnosis not present
